# Patient Record
Sex: Male | Born: 1995 | Race: Black or African American | Hispanic: No | Marital: Single | Smoking: Current some day smoker
Health system: Southern US, Community
[De-identification: ages and names within clinical notes are randomized; demographics above are authoritative.]

## PROBLEM LIST (undated history)

## (undated) HISTORY — PX: FOREARM SURGERY: SHX651

---
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
## 2017-05-29 ENCOUNTER — Emergency Department (HOSPITAL_COMMUNITY)
Admission: EM | Admit: 2017-05-29 | Discharge: 2017-05-29 | Discharge status: Against Medical Advice | Payer: Medicaid Other | Attending: Emergency Medicine | Admitting: Emergency Medicine

## 2017-05-29 ENCOUNTER — Encounter (HOSPITAL_COMMUNITY): Payer: Self-pay | Admitting: Emergency Medicine

## 2017-05-29 DIAGNOSIS — Z5321 Procedure and treatment not carried out due to patient leaving prior to being seen by health care provider: Secondary | ICD-10-CM | POA: Insufficient documentation

## 2017-05-29 DIAGNOSIS — J029 Acute pharyngitis, unspecified: Secondary | ICD-10-CM | POA: Diagnosis present

## 2017-05-29 NOTE — ED Triage Notes (Signed)
Pt c/o sore throat, weakness, and flu like symptoms. Pt also c/o constipation. States symptoms have been present x 1 week.

## 2017-05-29 NOTE — ED Notes (Signed)
Bed: UQ33 Expected date:  Expected time:  Means of arrival:  Comments: tri

## 2017-05-29 NOTE — ED Notes (Signed)
Second attempt...the patient not in waiting area

## 2017-05-29 NOTE — ED Notes (Signed)
First attempt...PT did not answer in waiting area

## 2017-05-31 ENCOUNTER — Ambulatory Visit (HOSPITAL_COMMUNITY)
Admission: EM | Admit: 2017-05-31 | Discharge: 2017-05-31 | Disposition: A | Discharge status: Home / Self Care | Payer: Medicaid Other | Attending: Family Medicine | Admitting: Family Medicine

## 2017-05-31 ENCOUNTER — Encounter (HOSPITAL_COMMUNITY): Payer: Self-pay | Admitting: *Deleted

## 2017-05-31 ENCOUNTER — Emergency Department (HOSPITAL_COMMUNITY): Payer: Medicaid Other

## 2017-05-31 DIAGNOSIS — F17200 Nicotine dependence, unspecified, uncomplicated: Secondary | ICD-10-CM | POA: Diagnosis not present

## 2017-05-31 DIAGNOSIS — R0789 Other chest pain: Secondary | ICD-10-CM | POA: Diagnosis present

## 2017-05-31 DIAGNOSIS — R002 Palpitations: Secondary | ICD-10-CM | POA: Insufficient documentation

## 2017-05-31 DIAGNOSIS — F419 Anxiety disorder, unspecified: Secondary | ICD-10-CM | POA: Diagnosis not present

## 2017-05-31 DIAGNOSIS — J069 Acute upper respiratory infection, unspecified: Secondary | ICD-10-CM | POA: Diagnosis not present

## 2017-05-31 LAB — CBC
HEMATOCRIT: 41.4 % (ref 39.0–52.0)
HEMOGLOBIN: 13.9 g/dL (ref 13.0–17.0)
MCH: 25.8 pg — AB (ref 26.0–34.0)
MCHC: 33.6 g/dL (ref 30.0–36.0)
MCV: 76.8 fL — ABNORMAL LOW (ref 78.0–100.0)
Platelets: 200 10*3/uL (ref 150–400)
RBC: 5.39 MIL/uL (ref 4.22–5.81)
RDW: 14 % (ref 11.5–15.5)
WBC: 13.4 10*3/uL — ABNORMAL HIGH (ref 4.0–10.5)

## 2017-05-31 LAB — POCT I-STAT TROPONIN I: Troponin i, poc: 0 ng/mL (ref 0.00–0.08)

## 2017-05-31 MED ORDER — SALINE SPRAY 0.65 % NA SOLN: 1.0000 | NASAL | 0 refills | Status: AC | PRN

## 2017-05-31 MED ORDER — IBUPROFEN 600 MG PO TABS: 600.0000 mg | ORAL_TABLET | Freq: Four times a day (QID) | ORAL | 0 refills | Status: AC | PRN

## 2017-05-31 NOTE — ED Provider Notes (Signed)
[facility]    CSN: [id number] Arrival date & time: 05/31/17  1059     History   Chief Complaint Chief Complaint  Patient presents with  . Nasal Congestion  . Sore Throat  . Generalized Body Aches    HPI Kevin Klein is a 21 y.o. male  with no medical history presenting for 3 days of nasal congestion, sore throat.   He noticed gradual onset of severe nasal congestion and post nasal drip with sore throat worst in the mornings associated with generally feeling unwell with body aches and poor appetite. She denies chest pain, dyspnea, cough, fever, abdominal pain, N/V/D, dysuria, arthralgias, rash. No sick contacts. Triad theraflu with no improvement.   HPI  History reviewed. No pertinent past medical history.  There are no active problems to display for this patient.   Past Surgical History:  Procedure Laterality Date  . FOREARM SURGERY     bil    Home Medications    Prior to Admission medications   Medication Sig Start Date End Date Taking? Authorizing Provider  ibuprofen (ADVIL,MOTRIN) 600 MG tablet Take 1 tablet (600 mg total) by mouth every 6 (six) hours as needed. 05/31/17   Tyrone Nine, MD  sodium chloride (OCEAN) 0.65 % SOLN nasal spray Place 1 spray into both nostrils as needed for congestion. 05/31/17   Tyrone Nine, MD    Family History History reviewed. No pertinent family history.  Social History Social History  Substance Use Topics  . Smoking status: Current Some Day Smoker  . Smokeless tobacco: Never Used  . Alcohol use Yes     Allergies   Patient has no known allergies.   Review of Systems Review of Systems As above  Physical Exam Triage Vital Signs ED Triage Vitals  Enc Vitals Group     BP 05/31/17 1155 121/67     Pulse Rate 05/31/17 1155 100     Resp 05/31/17 1155 18     Temp 05/31/17 1155 97.7 F (36.5 C)     Temp Source 05/31/17 1155 Oral     SpO2 05/31/17 1155 98 %     Weight 05/31/17 1156 200 lb (90.7 kg)   Height 05/31/17 1156 5\' 9"  (1.753 m)     Head Circumference --      Peak Flow --      Pain Score 05/31/17 1200 6     Pain Loc --      Pain Edu? --      Excl. in GC? --    No data found.   Updated Vital Signs BP 121/67 (BP Location: Right Arm)   Pulse 100   Temp 97.7 F (36.5 C) (Oral)   Resp 18   Ht 5\' 9"  (1.753 m)   Wt 200 lb (90.7 kg)   SpO2 98%   BMI 29.53 kg/m   Visual Acuity Right Eye Distance:   Left Eye Distance:   Bilateral Distance:    Right Eye Near:   Left Eye Near:    Bilateral Near:     Physical Exam GEN: well developed, well nourished, no distress HEENT: normocephalic, moist mucous membranes, eyes normal, TMs grey bilaterally without effusion or inflammation, boggy turbinates with clear rhinorrhea, oropharynx clear, erythematous without exudates NECK: supple, painless full AROM, no occipital or cervical lymphadenopathy CHEST: normal air exchange with normal respiratory effort and no retractions; no rales, no rhonchi, no wheezes HEART: regular rate, normal S1/S2, no murmurs   UC Treatments / Results  Labs (all labs ordered are listed, but only abnormal results are displayed) Labs Reviewed - No data to display  EKG  EKG Interpretation None       Radiology No results found.  Procedures Procedures (including critical care time)  Medications Ordered in UC Medications - No data to display   Initial Impression / Assessment and Plan / UC Course  I have reviewed the triage vital signs and the nursing notes.  Pertinent labs & imaging results that were available during my care of the patient were reviewed by me and considered in my medical decision making (see chart for details).  Final Clinical Impressions(s) / UC Diagnoses   Final diagnoses:  Viral URI   21 y.o. male with 3 days of viral syndrome symptoms.  - Supportive care - RTC if not improving as expected  New Prescriptions New Prescriptions   IBUPROFEN (ADVIL,MOTRIN) 600 MG  TABLET    Take 1 tablet (600 mg total) by mouth every 6 (six) hours as needed.   SODIUM CHLORIDE (OCEAN) 0.65 % SOLN NASAL SPRAY    Place 1 spray into both nostrils as needed for congestion.      Tyrone Nine, MD 05/31/17 1300

## 2017-05-31 NOTE — ED Triage Notes (Addendum)
Patient reports decreased appetite, nasal congestion, constipation, body aches, and sore throat x several days. Patient reports taking OTC theraflu.

## 2017-05-31 NOTE — ED Triage Notes (Signed)
Pt reports noticing palpitation with L cp and diaphoresis tonight while texting.  Pt now reports cp is resolved but only feels it when he takes a deep breath.  Pt denies any SOB or dizziness.  Pt reports having same cp x 2 weeks ago but he thought it's panic attack.  No hx of panic attack.  Pt is A&Ox 4.  In NAD.

## 2017-05-31 NOTE — Discharge Instructions (Signed)
Use lozenges, nasal saline, and ibuprofen for symptoms. If not improving in the next 7 - 10 days, seek medical attention.

## 2017-06-01 ENCOUNTER — Emergency Department (HOSPITAL_COMMUNITY)
Admission: EM | Admit: 2017-06-01 | Discharge: 2017-06-01 | Disposition: A | Discharge status: Home / Self Care | Payer: Medicaid Other | Attending: Emergency Medicine | Admitting: Emergency Medicine

## 2017-06-01 DIAGNOSIS — F419 Anxiety disorder, unspecified: Secondary | ICD-10-CM

## 2017-06-01 DIAGNOSIS — R002 Palpitations: Secondary | ICD-10-CM

## 2017-06-01 DIAGNOSIS — R079 Chest pain, unspecified: Secondary | ICD-10-CM

## 2017-06-01 LAB — BASIC METABOLIC PANEL
ANION GAP: 9 (ref 5–15)
BUN: 10 mg/dL (ref 6–20)
CHLORIDE: 104 mmol/L (ref 101–111)
CO2: 24 mmol/L (ref 22–32)
Calcium: 8.6 mg/dL — ABNORMAL LOW (ref 8.9–10.3)
Creatinine, Ser: 1.09 mg/dL (ref 0.61–1.24)
GFR calc non Af Amer: >60 mL/min (ref >60)
Glucose, Bld: 112 mg/dL — ABNORMAL HIGH (ref 65–99)
POTASSIUM: 3.5 mmol/L (ref 3.5–5.1)
SODIUM: 137 mmol/L (ref 135–145)

## 2017-06-01 LAB — POCT I-STAT TROPONIN I: TROPONIN I, POC: 0 ng/mL (ref 0.00–0.08)

## 2017-06-01 LAB — D-DIMER, QUANTITATIVE (NOT AT ARMC)

## 2017-06-01 LAB — RAPID STREP SCREEN (MED CTR MEBANE ONLY): STREPTOCOCCUS, GROUP A SCREEN (DIRECT): NEGATIVE

## 2017-06-01 NOTE — Discharge Instructions (Signed)
Consider being evaluated for your anxiety.Your tests tonight are good, no evidence of a heart attack, blood clot, pneumonia, or any other problem in your chest.

## 2017-06-01 NOTE — ED Provider Notes (Signed)
[facility] Provider Note   CSN: [id number] Arrival date & time: 05/31/17  2235  Time seen 4:10 AM   History   Chief Complaint Chief Complaint  Patient presents with  . Palpitations  . Chest Pain    HPI Kevin Klein is a 21 y.o. male.  HPI  patient reports he started getting chest pain and points to the center of his chest that is sharp and comes and goes. He states that it  lasts a few seconds at a time. He denies shortness of breath but states he didn't get sweaty. He denies nausea, vomiting, or radiation the pain. He states he has had the pain before the last time was about a month ago. Patient states he was laying down text in and was not upset when the problem started. He has had no episodes since she's been to the ED. He denies any pain or swelling of his legs, he denies any recent traveling or change in his activity. He does report he has a mild sore throat. He does report a lot of anxiety.   PCP .none  History reviewed. No pertinent past medical history.  There are no active problems to display for this patient.   Past Surgical History:  Procedure Laterality Date  . FOREARM SURGERY     bil       Home Medications    Prior to Admission medications   Medication Sig Start Date End Date Taking? Authorizing Provider  ibuprofen (ADVIL,MOTRIN) 600 MG tablet Take 1 tablet (600 mg total) by mouth every 6 (six) hours as needed. 05/31/17   Tyrone Nine, MD  sodium chloride (OCEAN) 0.65 % SOLN nasal spray Place 1 spray into both nostrils as needed for congestion. 05/31/17   Tyrone Nine, MD    Family History No family history on file.  Social History Social History  Substance Use Topics  . Smoking status: Current Some Day Smoker  . Smokeless tobacco: Never Used  . Alcohol use Yes  employed Denies smoking   Allergies   Patient has no known allergies.   Review of Systems Review of Systems  All other systems reviewed and are negative.    Physical  Exam Updated Vital Signs BP 122/83   Pulse 94   Resp (!) 24   SpO2 100%   Vital signs normal    Physical Exam  Constitutional: He is oriented to person, place, and time. He appears well-developed and well-nourished.  Non-toxic appearance. He does not appear ill. No distress.  HENT:  Head: Normocephalic and atraumatic.  Right Ear: External ear normal.  Left Ear: External ear normal.  Nose: Nose normal. No mucosal edema or rhinorrhea.  Mouth/Throat: Oropharynx is clear and moist and mucous membranes are normal. No dental abscesses or uvula swelling.  Eyes: Pupils are equal, round, and reactive to light. Conjunctivae and EOM are normal.  Neck: Normal range of motion and full passive range of motion without pain. Neck supple.  Cardiovascular: Normal rate, regular rhythm and normal heart sounds.  Exam reveals no gallop and no friction rub.   No murmur heard. Pulmonary/Chest: Effort normal and breath sounds normal. No respiratory distress. He has no wheezes. He has no rhonchi. He has no rales. He exhibits no tenderness and no crepitus.  Abdominal: Soft. Normal appearance and bowel sounds are normal. He exhibits no distension. There is no tenderness. There is no rebound and no guarding.  Musculoskeletal: Normal range of motion. He exhibits no edema or tenderness.  Moves all extremities well.   Neurological: He is alert and oriented to person, place, and time. He has normal strength. No cranial nerve deficit.  Skin: Skin is warm, dry and intact. No rash noted. No erythema. No pallor.  Psychiatric: He has a normal mood and affect. His speech is normal and behavior is normal. His mood appears not anxious.  Nursing note and vitals reviewed.    ED Treatments / Results  Labs (all labs ordered are listed, but only abnormal results are displayed) Results for orders placed or performed during the hospital encounter of 06/01/17  Rapid strep screen  Result Value Ref Range   Streptococcus, Group  A Screen (Direct) NEGATIVE NEGATIVE  Basic metabolic panel  Result Value Ref Range   Sodium 137 135 - 145 mmol/L   Potassium 3.5 3.5 - 5.1 mmol/L   Chloride 104 101 - 111 mmol/L   CO2 24 22 - 32 mmol/L   Glucose, Bld 112 (H) 65 - 99 mg/dL   BUN 10 6 - 20 mg/dL   Creatinine, Ser 1.61 0.61 - 1.24 mg/dL   Calcium 8.6 (L) 8.9 - 10.3 mg/dL   GFR calc non Af Amer >60 >60 mL/min   GFR calc Af Amer >60 >60 mL/min   Anion gap 9 5 - 15  CBC  Result Value Ref Range   WBC 13.4 (H) 4.0 - 10.5 K/uL   RBC 5.39 4.22 - 5.81 MIL/uL   Hemoglobin 13.9 13.0 - 17.0 g/dL   HCT 09.6 04.5 - 40.9 %   MCV 76.8 (L) 78.0 - 100.0 fL   MCH 25.8 (L) 26.0 - 34.0 pg   MCHC 33.6 30.0 - 36.0 g/dL   RDW 81.1 91.4 - 78.2 %   Platelets 200 150 - 400 K/uL  D-dimer, quantitative (not at [facility])  Result Value Ref Range   D-Dimer, Quant <0.27 0.00 - 0.50 ug/mL-FEU  POCT i-Stat troponin I  Result Value Ref Range   Troponin i, poc 0.00 0.00 - 0.08 ng/mL   Comment 3          POCT i-Stat troponin I  Result Value Ref Range   Troponin i, poc 0.00 0.00 - 0.08 ng/mL   Comment 3           Laboratory interpretation all normal except leukocytosis    EKG  EKG Interpretation  Date/Time:  Tuesday May 31 2017 23:20:25 EDT Ventricular Rate:  111 PR Interval:    QRS Duration: 87 QT Interval:  299 QTC Calculation: 407 R Axis:   72 Text Interpretation:  Sinus tachycardia Nonspecific T abnormalities, anterior leads No old tracing to compare Confirmed by Devoria Albe ([id number]) on 06/01/2017 3:32:34 AM       Radiology Dg Chest 2 View  Result Date: 05/31/2017 CLINICAL DATA:  Heart palpitations and chest pain for the past 2 hours. EXAM: CHEST  2 VIEW COMPARISON:  None. FINDINGS: The heart size and mediastinal contours are within normal limits. Both lungs are clear. The visualized skeletal structures are unremarkable. IMPRESSION: No active cardiopulmonary disease. Electronically Signed   By: Tollie Eth M.D.   On: 05/31/2017  23:38    Procedures Procedures (including critical care time)  Medications Ordered in ED Medications - No data to display   Initial Impression / Assessment and Plan / ED Course  I have reviewed the triage vital signs and the nursing notes.  Pertinent labs & imaging results that were available during my care of the patient were reviewed by me  and considered in my medical decision making (see chart for details).    Delta troponin is ordered.  Patient's delta troponin is negative.   6:45 AM patient's d-dimer is negative and his strep screen is negative. Patient was discharged home. Patient states he has a lot of anxiety. He will be referred to get evaluation for that. At this point it does not appear that he has a physiological reason for his chest pain.  Final Clinical Impressions(s) / ED Diagnoses   Final diagnoses:  Chest pain, unspecified type  Palpitations  Anxiety    Plan discharge  Devoria Albe, MD, Concha Pyo, MD 06/01/17 [phone]

## 2017-06-03 LAB — CULTURE, GROUP A STREP (THRC)

## 2017-12-16 ENCOUNTER — Encounter (HOSPITAL_COMMUNITY): Payer: Self-pay | Admitting: Emergency Medicine

## 2017-12-16 ENCOUNTER — Ambulatory Visit (HOSPITAL_COMMUNITY)
Admission: EM | Admit: 2017-12-16 | Discharge: 2017-12-16 | Disposition: A | Discharge status: Home / Self Care | Payer: Medicaid Other | Attending: Internal Medicine | Admitting: Internal Medicine

## 2017-12-16 DIAGNOSIS — S0083XA Contusion of other part of head, initial encounter: Secondary | ICD-10-CM

## 2017-12-16 NOTE — ED Triage Notes (Signed)
Pt sts hit ion right side of face on Tuesday while boxing and having some numbness to nose

## 2017-12-16 NOTE — ED Provider Notes (Signed)
[facility]    CSN: [id number] Arrival date & time: 12/16/17  [id number]     History   Chief Complaint Chief Complaint  Patient presents with  . Facial Pain    HPI Kevin Klein is a 22 y.o. male.   He presents today couple days after receiving a blow to the right eye area while sparring.  He had a headache for a few hours after the incident, which resolved with Tylenol.  Had some blurry vision for less than an hour after the incident which has not recurred.  No focal weakness or clumsiness.  Able to walk into the urgent care independently and climb on to the exam table.  Did not fall.  Vomiting.  Has had specks of blood in expectorated morning phlegm a couple times.  Denies significant nasal congestion/cough/sore throat.    HPI  History reviewed. No pertinent past medical history.   Past Surgical History:  Procedure Laterality Date  . FOREARM SURGERY     bil       Home Medications    Prior to Admission medications   Medication Sig Start Date End Date Taking? Authorizing Provider  ibuprofen (ADVIL,MOTRIN) 600 MG tablet Take 1 tablet (600 mg total) by mouth every 6 (six) hours as needed. 05/31/17   Tyrone NineGrunz, Ryan B, MD  sodium chloride (OCEAN) 0.65 % SOLN nasal spray Place 1 spray into both nostrils as needed for congestion. 05/31/17   Tyrone NineGrunz, Ryan B, MD    Family History History reviewed. No pertinent family history.  Social History Social History   Tobacco Use  . Smoking status: Current Some Day Smoker  . Smokeless tobacco: Never Used  Substance Use Topics  . Alcohol use: Yes  . Drug use: No     Allergies   Patient has no known allergies.   Review of Systems Review of Systems  All other systems reviewed and are negative.    Physical Exam Triage Vital Signs ED Triage Vitals [12/16/17 1014]  Enc Vitals Group     BP 124/62     Pulse Rate (!) 57     Resp 16     Temp 97.8 F (36.6 C)     Temp Source Oral     SpO2 100 %     Weight      Height       Pain Score      Pain Loc    Updated Vital Signs BP 124/62 (BP Location: Left Arm)   Pulse (!) 57   Temp 97.8 F (36.6 C) (Oral)   Resp 16   SpO2 100%  Physical Exam  Constitutional: He is oriented to person, place, and time. No distress.  Alert, nicely groomed  HENT:  Head: Atraumatic.  Faint bruising and swelling above and around the right orbit, tender to palpation over the right maxillary sinus and the right eyebrow, but without step-off palpable. Bilateral TMs are moderately congested, red tinged, no hemotympanum Moderate nasal congestion bilaterally, no nasal septal hematoma Posterior pharynx is notable for scant postnasal blood  Eyes: EOM are normal. Pupils are equal, round, and reactive to light.  Conjugate gaze, no eye redness/drainage  Neck: Neck supple.  Cardiovascular: Normal rate and regular rhythm.  Pulmonary/Chest: No respiratory distress. He has no wheezes. He has no rales.  Lungs clear, symmetric breath sounds   Abdominal: He exhibits no distension.  Musculoskeletal: Normal range of motion.  Neurological: He is alert and oriented to person, place, and time.  Face  is symmetric, speech is clear/coherent, frown and smile are symmetric, able to raise both eyebrows Patient has slightly decreased sensation of the right upper lip, says teeth feel a little numb, these have improved overall in the last couple days  Skin: Skin is warm and dry.  No cyanosis  Nursing note and vitals reviewed.    UC Treatments / Results   Procedures Procedures (including critical care time) None today  Final Clinical Impressions(s) / UC Diagnoses   Final diagnoses:  Facial contusion, initial encounter   Numbness to lip/nose are likely due to a little bit of nerve bruising from punch to eye, and will improve gradually.  May take several weeks or as long as 6 months.  Use afrin nasal spray (generic is fine) twice a day for the next 3-4 days, to decongest your nose and decrease  risk of a sinus infection.  Ice to your cheek for 5-10 minutes several times daily will help with swelling from the punch also.     Isa Rankin, MD 12/19/17 [phone]

## 2017-12-16 NOTE — Discharge Instructions (Addendum)
Numbness to lip/nose are likely due to a little bit of nerve bruising from punch to eye, and will improve gradually.  May take several weeks or as long as 6 months.  Use afrin nasal spray (generic is fine) twice a day for the next 3-4 days, to decongest your nose and decrease risk of a sinus infection.  Ice to your cheek for 5-10 minutes several times daily will help with swelling from the punch also.

## 2018-01-11 IMAGING — CR DG CHEST 2V
2 series · 2 of 2 positions shown · non-contrast
Comparison: None.

CLINICAL DATA: Heart palpitations and chest pain for the past 2
hours.

EXAM:
CHEST  2 VIEW

[w chest pa]
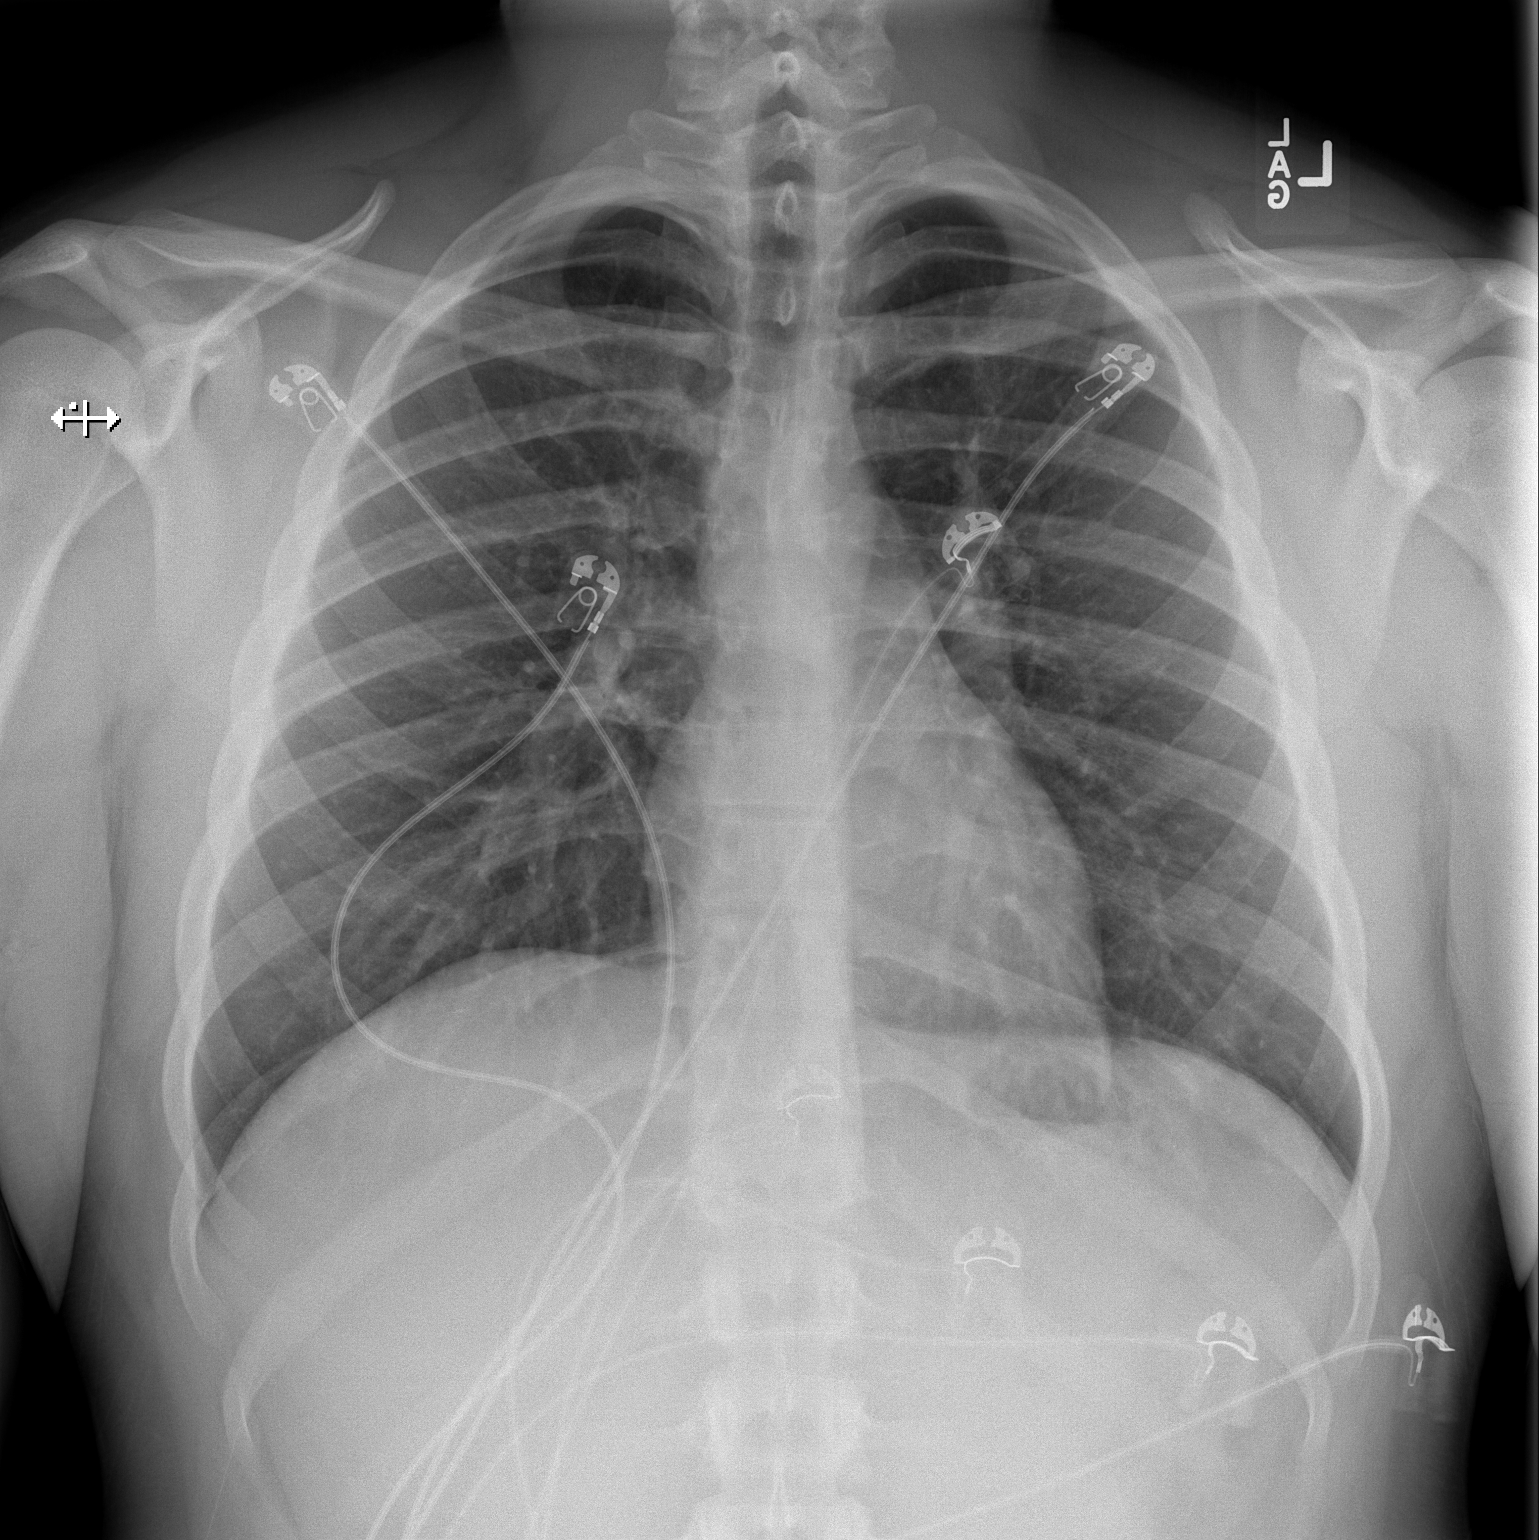

[w chest lat]
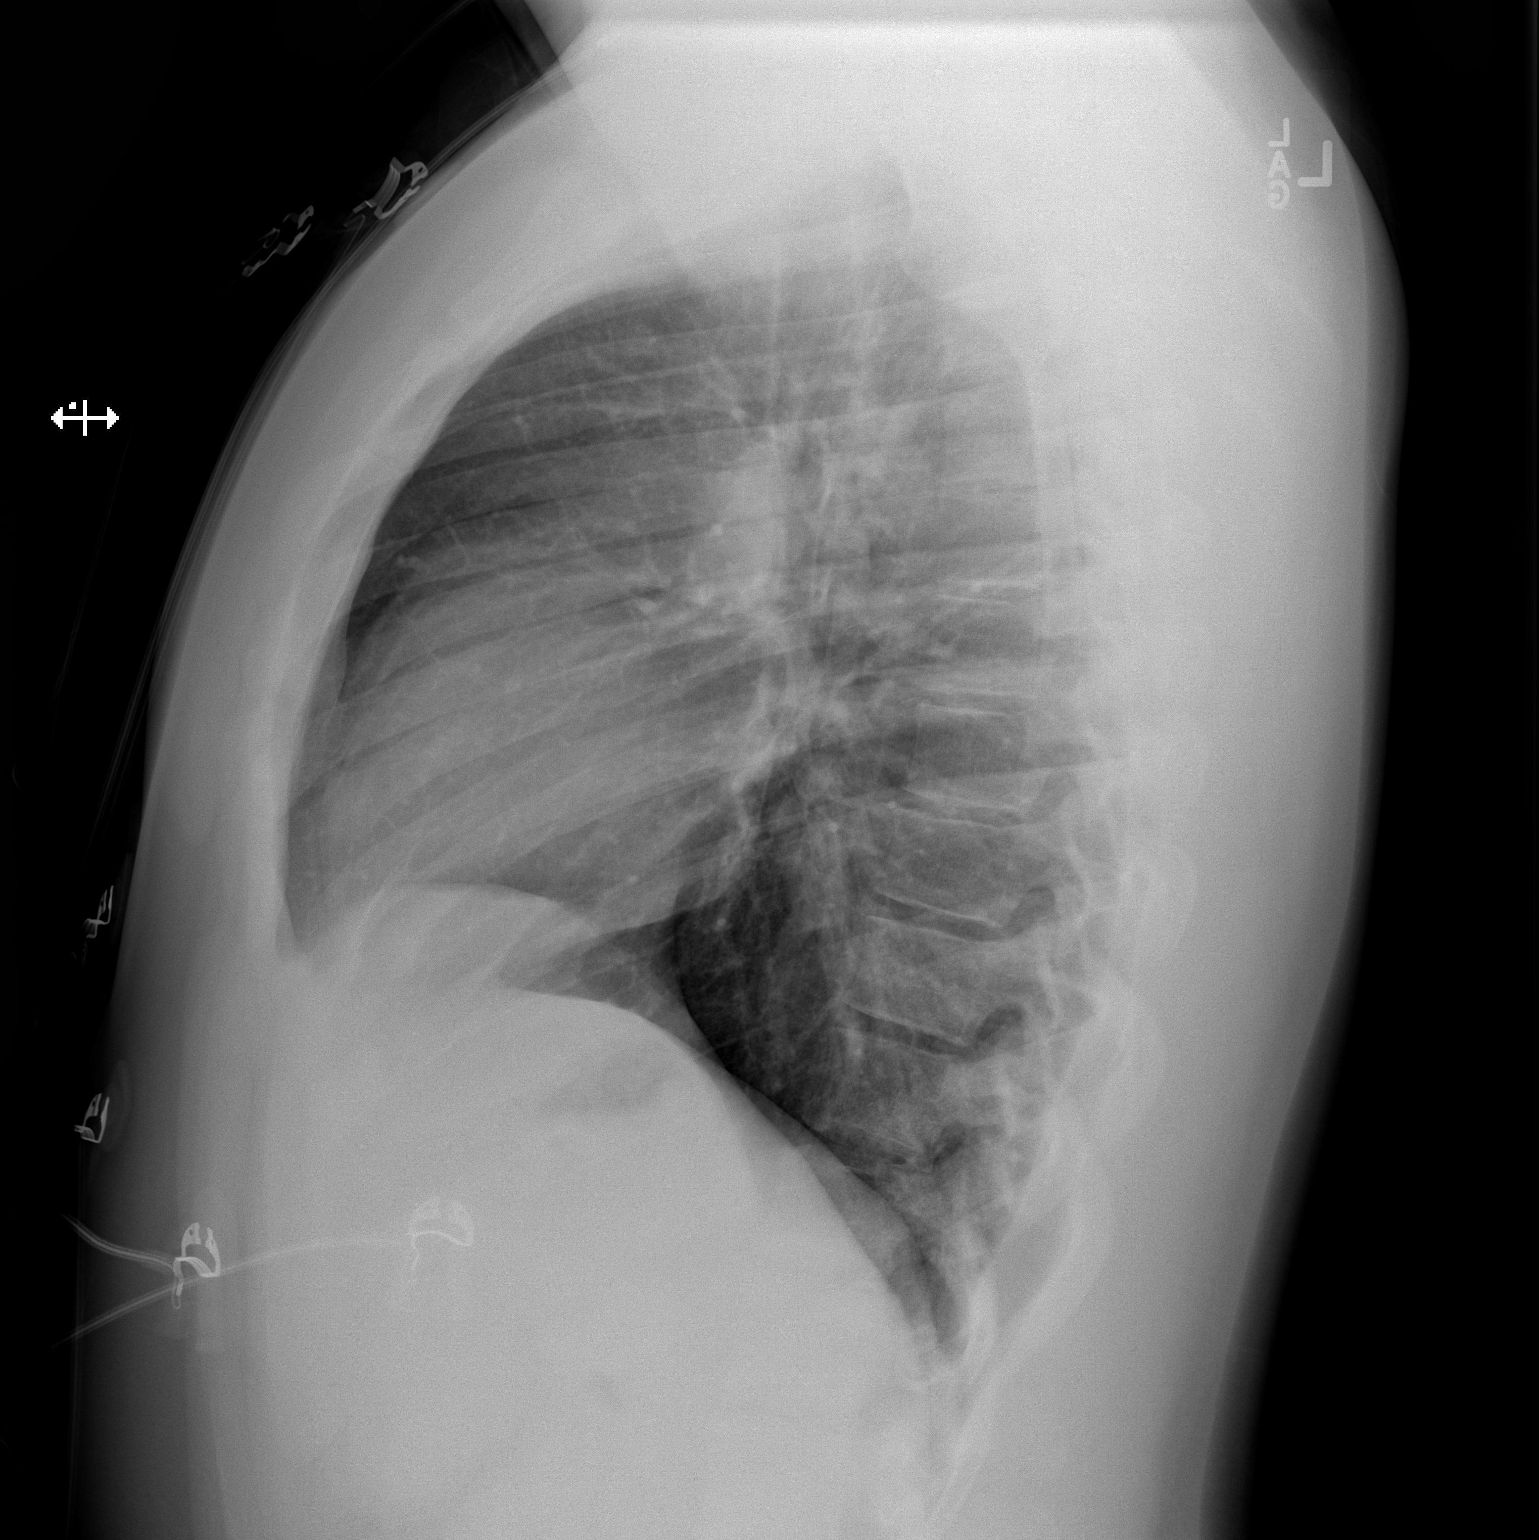

[2 of 2 positions shown; findings below may reference images not displayed]

FINDINGS: The heart size and mediastinal contours are within normal limits.
Both lungs are clear. The visualized skeletal structures are
unremarkable.
IMPRESSION: No active cardiopulmonary disease.
